# Patient Record
Sex: Male | Born: 1990 | Race: Black or African American | Hispanic: No | Smoking: Never smoker
Health system: Southern US, Community
[De-identification: ages and names within clinical notes are randomized; demographics above are authoritative.]

---

## 2018-05-12 ENCOUNTER — Ambulatory Visit
Admission: EM | Admit: 2018-05-12 | Discharge: 2018-05-12 | Disposition: A | Discharge status: Home / Self Care | Payer: Self-pay | Attending: Emergency Medicine | Admitting: Emergency Medicine

## 2018-05-12 ENCOUNTER — Encounter: Payer: Self-pay | Admitting: Emergency Medicine

## 2018-05-12 ENCOUNTER — Other Ambulatory Visit: Payer: Self-pay

## 2018-05-12 DIAGNOSIS — R05 Cough: Secondary | ICD-10-CM

## 2018-05-12 DIAGNOSIS — R0981 Nasal congestion: Secondary | ICD-10-CM

## 2018-05-12 DIAGNOSIS — J029 Acute pharyngitis, unspecified: Secondary | ICD-10-CM

## 2018-05-12 DIAGNOSIS — J020 Streptococcal pharyngitis: Secondary | ICD-10-CM

## 2018-05-12 LAB — RAPID STREP SCREEN (MED CTR MEBANE ONLY): Streptococcus, Group A Screen (Direct): POSITIVE — AB

## 2018-05-12 MED ORDER — IBUPROFEN 600 MG PO TABS: 600.0000 mg | ORAL_TABLET | Freq: Four times a day (QID) | ORAL | 0 refills | Status: DC | PRN

## 2018-05-12 MED ORDER — DEXAMETHASONE SODIUM PHOSPHATE 10 MG/ML IJ SOLN
10.0000 mg | Freq: Once | INTRAMUSCULAR | Status: AC
  Administered 2018-05-12: 10 mg via INTRAMUSCULAR

## 2018-05-12 MED ORDER — PENICILLIN G BENZATHINE 1200000 UNIT/2ML IM SUSP
1.2000 10*6.[IU] | Freq: Once | INTRAMUSCULAR | Status: AC
  Administered 2018-05-12: 1.2 10*6.[IU] via INTRAMUSCULAR

## 2018-05-12 NOTE — Discharge Instructions (Signed)
We treated you for strep throat today with a shot of Bicillin and Decadron, which is a steroid to help with the pain and swelling.  1 gram of Tylenol and 600 mg ibuprofen together 3-4 times a day as needed for pain.  Make sure you drink plenty of extra fluids.  Some people find salt water gargles and  Traditional Medicinal's "Throat Coat" tea helpful. Take 5 mL of liquid Benadryl and 5 mL of Maalox. Mix it together, and then hold it in your mouth for as long as you can and then swallow. You may do this 4 times a day.    Here is a list of primary care providers who are taking new patients:  Dr. Elizabeth Sauer, Dr. Schuyler Amor [address] [place] [address] [phone]  [facility] [address] [place] [address]  [phone]  [facility] [address] [place], [place] [address] [phone]  [facility] [address] [place]  [phone] [place], [place] [address]  Here are clinics/ other resources who will see you if you do not have insurance. Some have certain criteria that you must meet. Call them and find out what they are:  [facility]: [address], [place], [place] [address] Phone: [phone] Hours: First and Third Saturdays of each Month, 9 a.m. - 1 p.m.  [facility]: [address] [place], [place] [address] Phone: [phone] Hours: Tuesday, 4 p.m. - 8 p.m. Thursday, 1 p.m. - 8 p.m. Wednesday, 9 a.m. - [facility] [address] [place], [place] [address] Phone: [phone] Pharmacy Phone Number: [phone] Dental Phone Number: [phone] [facility] Insurance Help: [phone]  Dental Hours: Monday - Thursday, 8 a.m. - 6 p.m.  [facility] [address], [place], [place] [address] Phone: [phone] Pharmacy Phone Number: [phone] [facility] Insurance Help: [phone]  [facility] [address] [place]  [place], [place] [address] Phone: [phone] Pharmacy Phone Number: [phone] [facility] Insurance Help: [phone]  [facility] [address] [place], [place] [address] Phone: [phone] [facility] Insurance Help: [phone]   [facility] [address], [place], [place] [address] Phone: [phone]  Go to www.goodrx.com to look up your medications. This will give you a list of where you can find your prescriptions at the most affordable prices. Or ask the pharmacist what the cash price is, or if they have any other discount programs available to help make your medication more affordable. This can be less expensive than what you would pay with insurance.

## 2018-05-12 NOTE — ED Provider Notes (Signed)
HPI  SUBJECTIVE:  Patient reports sore throat starting 2 days ago. Sx worse with swallowing.  Sx better with ice cream. Has been taking TheraFlu, cough drops, hot soups,  salt water gargles, Chloraseptic w/ o relief.  No fever   No neck stiffness  +Cough, nasal congestion.  No rhinorrhea, postnasal drip. No Myalgias + Headache No Rash     No Recent Strep no exposure No Abdominal Pain No reflux sxs No Allergy sxs  No Breathing difficulty, voice changes + Feels like his throat is swollen, but not swelling shut No Drooling No Trismus No abx in past month.  No antipyretic in past 4-6 hrs Past medical history negative for frequent strep, diabetes, hypertension PMD: None   History reviewed. No pertinent past medical history.  History reviewed. No pertinent surgical history.  Family History  Problem Relation Age of Onset  . Healthy Mother   . Healthy Father     Social History   Tobacco Use  . Smoking status: Never Smoker  . Smokeless tobacco: Never Used  Substance Use Topics  . Alcohol use: Not Currently  . Drug use: Never     Current Facility-Administered Medications:  .  dexamethasone (DECADRON) injection 10 mg, 10 mg, Intramuscular, Once, Domenick Gong, MD .  penicillin g benzathine (BICILLIN LA) 1200000 UNIT/2ML injection 1.2 Million Units, 1.2 Million Units, Intramuscular, Once, Domenick Gong, MD  Current Outpatient Medications:  .  ibuprofen (ADVIL,MOTRIN) 600 MG tablet, Take 1 tablet (600 mg total) by mouth every 6 (six) hours as needed., Disp: 30 tablet, Rfl: 0  No Known Allergies   ROS  As noted in HPI.   Physical Exam  BP (!) 149/102 (BP Location: Left Arm)   Pulse (!) 101   Temp 98.2 F (36.8 C) (Oral)   Resp 16   Ht 5\' 8"  (1.727 m)   Wt 88.5 kg   SpO2 98%   BMI 29.65 kg/m   Constitutional: Well developed, well nourished, no acute distress Eyes:  EOMI, conjunctiva normal bilaterally HENT: Normocephalic, atraumatic,mucus  membranes moist. +  nasal congestion + intensely erythematous oropharynx + enlarged tonsils + exudates. Uvula midline.  Respiratory: Normal inspiratory effort Cardiovascular: Normal rate, no murmurs, rubs, gallops GI: nondistended, nontender. No appreciable splenomegaly skin: No rash, skin intact Lymph: + Anterior cervical LN.  + single posterior cervical lymph node R side Musculoskeletal: no deformities Neurologic: Alert & oriented x 3, no focal neuro deficits Psychiatric: Speech and behavior appropriate. At baseline mental status per caregiver.   ED Course   Medications  penicillin g benzathine (BICILLIN LA) 1200000 UNIT/2ML injection 1.2 Million Units (has no administration in time range)  dexamethasone (DECADRON) injection 10 mg (has no administration in time range)    Orders Placed This Encounter  Procedures  . Rapid Strep Screen ([facility] ONLY)    Standing Status:   Standing    Number of Occurrences:   1    Results for orders placed or performed during the hospital encounter of 05/12/18 (from the past 24 hour(s))  Rapid Strep Screen ([facility] ONLY)     Status: Abnormal   Collection Time: 05/12/18 10:40 AM  Result Value Ref Range   Streptococcus, Group A Screen (Direct) POSITIVE (A) NEGATIVE   No results found.  ED Clinical Impression  Strep pharyngitis   ED Assessment/Plan  Rapid strep positive. Bicillin 1,200,000 units IM, and Decadron 10 mg IM due to the tonsillar swelling.  There is no evidence of impending airway compromise.[facility] Home  with ibuprofen, Tylenol, Benadryl/Maalox mixture. Patient to followup with PMD of choice when necessary, will refer to local primary care resources.  Discussed labs,  MDM, plan and followup with patient. Discussed sn/sx that should prompt return to the ED. patient agrees with plan.   Meds ordered this encounter  Medications  . penicillin g benzathine (BICILLIN LA) 1200000 UNIT/2ML injection 1.2 Million Units    Order  Specific Question:   Antibiotic Indication:    Answer:   Pharyngitis  . dexamethasone (DECADRON) injection 10 mg  . ibuprofen (ADVIL,MOTRIN) 600 MG tablet    Sig: Take 1 tablet (600 mg total) by mouth every 6 (six) hours as needed.    Dispense:  30 tablet    Refill:  0     *This clinic note was created using Scientist, clinical (histocompatibility and immunogenetics). Therefore, there may be occasional mistakes despite careful proofreading.     Domenick Gong, MD 05/13/18 [phone]

## 2018-05-12 NOTE — ED Triage Notes (Signed)
Patient c/o sore throat since Wed.  Patient denies fevers

## 2021-05-06 ENCOUNTER — Other Ambulatory Visit: Payer: Self-pay

## 2021-05-06 ENCOUNTER — Encounter: Payer: Self-pay | Admitting: Emergency Medicine

## 2021-05-06 ENCOUNTER — Emergency Department: Payer: 59

## 2021-05-06 ENCOUNTER — Emergency Department
Admission: EM | Admit: 2021-05-06 | Discharge: 2021-05-07 | Disposition: A | Discharge status: Home / Self Care | Payer: 59 | Attending: Student in an Organized Health Care Education/Training Program | Admitting: Student in an Organized Health Care Education/Training Program

## 2021-05-06 DIAGNOSIS — R202 Paresthesia of skin: Secondary | ICD-10-CM | POA: Insufficient documentation

## 2021-05-06 DIAGNOSIS — R791 Abnormal coagulation profile: Secondary | ICD-10-CM | POA: Diagnosis not present

## 2021-05-06 DIAGNOSIS — R29810 Facial weakness: Secondary | ICD-10-CM | POA: Diagnosis present

## 2021-05-06 LAB — CBC
HCT: 46.5 % (ref 39.0–52.0)
Hemoglobin: 14.9 g/dL (ref 13.0–17.0)
MCH: 27.1 pg (ref 26.0–34.0)
MCHC: 32 g/dL (ref 30.0–36.0)
MCV: 84.7 fL (ref 80.0–100.0)
Platelets: 275 10*3/uL (ref 150–400)
RBC: 5.49 MIL/uL (ref 4.22–5.81)
RDW: 13.3 % (ref 11.5–15.5)
WBC: 5.5 10*3/uL (ref 4.0–10.5)
nRBC: 0 % (ref 0.0–0.2)

## 2021-05-06 LAB — COMPREHENSIVE METABOLIC PANEL
ALT: 47 U/L — ABNORMAL HIGH (ref 0–44)
AST: 31 U/L (ref 15–41)
Albumin: 4.2 g/dL (ref 3.5–5.0)
Alkaline Phosphatase: 53 U/L (ref 38–126)
Anion gap: 9 (ref 5–15)
BUN: 14 mg/dL (ref 6–20)
CO2: 25 mmol/L (ref 22–32)
Calcium: 9.2 mg/dL (ref 8.9–10.3)
Chloride: 103 mmol/L (ref 98–111)
Creatinine, Ser: 1.2 mg/dL (ref 0.61–1.24)
GFR, Estimated: >60 mL/min (ref >60)
Glucose, Bld: 98 mg/dL (ref 70–99)
Potassium: 3.5 mmol/L (ref 3.5–5.1)
Sodium: 137 mmol/L (ref 135–145)
Total Bilirubin: 0.7 mg/dL (ref 0.3–1.2)
Total Protein: 7.9 g/dL (ref 6.5–8.1)

## 2021-05-06 LAB — DIFFERENTIAL
Abs Immature Granulocytes: 0.02 10*3/uL (ref 0.00–0.07)
Basophils Absolute: 0 10*3/uL (ref 0.0–0.1)
Basophils Relative: 0 %
Eosinophils Absolute: 0.2 10*3/uL (ref 0.0–0.5)
Eosinophils Relative: 4 %
Immature Granulocytes: 0 %
Lymphocytes Relative: 34 %
Lymphs Abs: 1.9 10*3/uL (ref 0.7–4.0)
Monocytes Absolute: 0.5 10*3/uL (ref 0.1–1.0)
Monocytes Relative: 9 %
Neutro Abs: 2.8 10*3/uL (ref 1.7–7.7)
Neutrophils Relative %: 53 %

## 2021-05-06 LAB — PROTIME-INR
INR: 1 (ref 0.8–1.2)
Prothrombin Time: 12.7 seconds (ref 11.4–15.2)

## 2021-05-06 LAB — APTT: aPTT: 30 seconds (ref 24–36)

## 2021-05-06 MED ORDER — PREDNISONE 10 MG PO TABS: 10.0000 mg | ORAL_TABLET | Freq: Every day | ORAL | 0 refills | Status: DC

## 2021-05-06 MED ORDER — VALACYCLOVIR HCL 1 G PO TABS: 1000.0000 mg | ORAL_TABLET | Freq: Three times a day (TID) | ORAL | 0 refills | Status: AC

## 2021-05-06 MED ORDER — GADOBUTROL 1 MMOL/ML IV SOLN
10.0000 mL | Freq: Once | INTRAVENOUS | Status: AC | PRN
  Administered 2021-05-06: 10 mL via INTRAVENOUS

## 2021-05-06 NOTE — ED Triage Notes (Signed)
Pt comes into the ED via POV c/o right side facial droop.  Pt states is started this morning around 3:00am.  Pt had recent dentistry done on the right side as well.  PT does present hypertensive.  Denies any other weakness currently.  Pt unable to close right eye all the way when eyes are closed.

## 2021-05-06 NOTE — ED Notes (Signed)
Patient transported to MRI 

## 2021-05-06 NOTE — Consult Note (Signed)
[facility] TeleNeurology Consult Services  Stat Consult  Patient Name:   Malik Young, Malik Young Date of Birth:   10/08/1990 Identification Number:   MRN - [id number] Date of Service:   05/06/2021 22:41:31  Diagnosis:       G51.0 - Bell palsy  Impression Manjinder Caraway is a 31yo gentleman pmh low back pain who presented to the ER with Right sided facial weakness since 0300am today. MRI brain w/wo incidental b/l white matter lesions in pattern suggestive of chronic demyelinating disease, no evidence of active demyelination. Discussed with patient that peripheral nerve disorder/bells palsy most likely, but that he may have underlying autoimmune condition that warrants further neurologic work up. Patient prefers to have further work up as an outpatient which is reasonable. Recommend outpatient neurology follow up and MS work up in 3-6 weeks.  Our recommendations are outlined below.  Disposition: Outpatient Neurology follow up in 3-6 weeks.  Additional Recommendations: House-Brackmann grade 4, recommend Prednisone 60-80mg  for 7 days.  Consider artificial tears for day time and eye ointment/patch for bedtime.    ----------------------------------------------------------------------------------------------------  Metrics: [facility] Notification Time: 05/06/2021 22:37:23 Stamp Time: 05/06/2021 22:41:31 Callback Response Time: 05/06/2021 22:42:57    Imaging CLINICAL DATA: Initial evaluation for neuro deficit, stroke suspected.   EXAM: MRI HEAD WITHOUT AND WITH CONTRAST   TECHNIQUE: Multiplanar, multiecho pulse sequences of the brain and surrounding structures were obtained without and with intravenous contrast.   CONTRAST: 54mL GADAVIST GADOBUTROL 1 MMOL/ML IV SOLN   COMPARISON: Prior head CT from earlier the same day.   FINDINGS: Brain: Examination somewhat technically limited by susceptibility artifact from dental hardware.   Cerebral volume within  normal limits for age. There are scattered patchy foci of T2/FLAIR signal primarily involving the subcortical white matter of both cerebral hemispheres, perhaps slightly worse on the right as compared to the left. Relatively mild involvement of the deep and periventricular white matter in comparison. No visible infratentorial involvement on this exam. No convincing associated enhancement. Apparent subtle patchy enhancement involving the left greater than right subcortical parieto-occipital regions noted on axial postcontrast sequence, not seen on corresponding coronal sequence, and favored to be artifactual (series 18, image 105).   No evidence for acute or subacute infarct. Gray-white matter differentiation maintained. No areas of chronic cortical infarction. No visible acute or chronic intracranial blood products.   No mass lesion, midline shift or mass effect no hydrocephalus or extra-axial fluid collection. Pituitary gland suprasellar region grossly within normal limits. Midline structures intact. No other abnormal enhancement.   Vascular: Major intracranial vascular flow voids are maintained.   Skull and upper cervical spine: Craniocervical junction within normal limits. Signal intensity within the visualized bone marrow somewhat diffusely decreased on T1 weighted imaging, nonspecific, but most commonly related to anemia, smoking or obesity. No focal marrow replacing lesion. No scalp soft tissue abnormality.   Sinuses/Orbits: Visualized globes and orbital soft tissues grossly within normal limits, although evaluation limited by motion and susceptibility artifact. Visualized paranasal sinuses are grossly clear. No mastoid effusion.   Other: None.   IMPRESSION: 1. Scattered patchy foci of T2/FLAIR signal abnormality involving primarily the subcortical white matter of both cerebral hemispheres, overall moderate in nature, and advanced for age. Differential considerations are  broad, and include sequelae of accelerated/hereditary chronic small-vessel ischemia, changes related to prior trauma, hypercoagulable state, vasculitis, sequelae of complicated migraines, prior infectious or inflammatory process, or demyelination. No convincing associated enhancement. 2. Otherwise unremarkable brain MRI. No other acute intracranial abnormality.     Electronically  Signed By: Jeannine Boga M.D. On: 05/06/2021 22:51  Labs cbc wnl. bun/cr/na wnl   ----------------------------------------------------------------------------------------------------  Chief Complaint: Right facial weakness  History of Present Illness: Patient is a 31 year old Male. Malik Young is a 31yo gentleman pmh low back pain who presented to the ER with Right sided facial weakness since 0300am today. His fiance is at bedside. No h/o transient neurologic deficits or similar symptoms.    Past Medical History:      There is no history of Hypertension      There is no history of Diabetes Mellitus      There is no history of Hyperlipidemia      There is no history of Atrial Fibrillation      There is no history of Coronary Artery Disease      There is no history of Stroke  Medications:  No Anticoagulant use  No Antiplatelet use Reviewed EMR for current medications  Allergies:  Reviewed  Social History: Smoking: No Alcohol Use: No Drug Use: No  Family History:  There is no family history of premature cerebrovascular disease pertinent to this consultation  ROS : 14 Points Review of Systems was performed and was negative except mentioned in HPI.  Past Surgical History: There Is No Surgical History Contributory To Todays Visit    Examination: BP(145/96), Pulse(74), Blood Glucose(98)  Neuro Exam: General: Alert,Awake, Oriented to Time, Place, Person  Speech: Fluent:  Language: Intact:  Face: Facial Droop: Right Upper and lower R facial weakness.  Facial  Sensation: Intact:  Visual Fields: Intact:  Extraocular Movements: Intact:  Motor Exam: No Drift:  Sensation: Intact:  Coordination: Intact:     Patient / Family was informed the Neurology Consult would occur via TeleHealth consult by way of interactive audio and video telecommunications and consented to receiving care in this manner.  Patient is being evaluated for possible acute neurologic impairment and high probability of imminent or life - threatening deterioration.I spent total of 30 minutes providing care to this patient, including time for face to face visit via telemedicine, review of medical records, imaging studies and discussion of findings with providers, the patient and / or family.   Dr Wendi Snipes   [facility] [phone]  Case AB:[id number]

## 2021-05-06 NOTE — ED Notes (Signed)
Tele Neurology cart placed in room.

## 2021-05-06 NOTE — ED Provider Notes (Signed)
[facility] Provider Note    Event Date/Time   First MD Initiated Contact with Patient 05/06/21 1909     (approximate)   History   Facial Droop   HPI  Malik Young is a 32 y.o. male with reported remote history of Bell's palsy no other chronic medical conditions presents to the ER for evaluation of right facial droop.  States that he started having some tingling on the right cheek before going to bed last night.  Then throughout the day has noted progressively worsening droop and having trouble closing his right eye.  He has had adjustment in his braces recently and thought it was related to that.  Denies any trauma no recent fevers.  Denies any chest pain or pressure.  No nausea or vomiting.     Physical Exam   Triage Vital Signs: ED Triage Vitals  Enc Vitals Group     BP 05/06/21 1804 (!) 165/127     Pulse Rate 05/06/21 1804 81     Resp 05/06/21 1804 17     Temp 05/06/21 1804 98.4 F (36.9 C)     Temp Source 05/06/21 1804 Oral     SpO2 05/06/21 1804 97 %     Weight 05/06/21 1805 220 lb (99.8 kg)     Height 05/06/21 1805 5\' 8"  (1.727 m)     Head Circumference --      Peak Flow --      Pain Score 05/06/21 1805 1     Pain Loc --      Pain Edu? --      Excl. in [place]? --     Most recent vital signs: Vitals:   05/06/21 1804 05/06/21 2137  BP: (!) 165/127 (!) 145/96  Pulse: 81 74  Resp: 17 16  Temp: 98.4 F (36.9 C) 97.9 F (36.6 C)  SpO2: 97% 96%     Constitutional: Alert  Eyes: Conjunctivae are normal.  Head: Atraumatic. Nose: No congestion/rhinnorhea. Mouth/Throat: Mucous membranes are moist.   Neck: Painless ROM.  Cardiovascular:   Good peripheral circulation. Respiratory: Normal respiratory effort.  No retractions.  Gastrointestinal: Soft and nontender.  Musculoskeletal:  no deformity Neurologic: CN- intact.  right facial droop with trace involvement of the forehead.  Normal FNF.  Normal heel to shin.  Sensation intact  bilaterally. Normal speech and language. No gross focal neurologic deficits are appreciated. No gait instability.  Skin:  Skin is warm, dry and intact. No rash noted. Psychiatric: Mood and affect are normal. Speech and behavior are normal.    ED Results / Procedures / Treatments   Labs (all labs ordered are listed, but only abnormal results are displayed) Labs Reviewed  COMPREHENSIVE METABOLIC PANEL - Abnormal; Notable for the following components:      Result Value   ALT 47 (*)    All other components within normal limits  PROTIME-INR  APTT  CBC  DIFFERENTIAL  CBG MONITORING, ED     EKG  ED ECG REPORT I, Merlyn Lot, the attending physician, personally viewed and interpreted this ECG.   Date: 05/06/2021  EKG Time: 18:11  Rate: 75  Rhythm: sinus  Axis: normal  Intervals:  normal intervals  ST&T Change: inferolateral t wave inversions    RADIOLOGY Please see ED Course for my review and interpretation.  I personally reviewed all radiographic images ordered to evaluate for the above acute complaints and reviewed radiology reports and findings.  These findings were personally discussed with the patient.  Please see medical record for radiology report.    PROCEDURES:  Critical Care performed: No  Procedures   MEDICATIONS ORDERED IN ED: Medications  gadobutrol (GADAVIST) 1 MMOL/ML injection 10 mL (10 mLs Intravenous Contrast Given 05/06/21 2055)     IMPRESSION / MDM / ASSESSMENT AND PLAN / ED COURSE  I reviewed the triage vital signs and the nursing notes.                              Differential diagnosis includes, but is not limited to, cva, bells palsy, tia, hypoglycemia, dehydration, electrolyte abnormality, dissection, sepsis  Patient presenting with right facial droop.  The patient will be placed on continuous pulse oximetry and telemetry for monitoring.  Laboratory evaluation will be sent to evaluate for the above complaints.    Clinical Course  as of 05/06/21 2332  Thu May 06, 2021  1951 CT imaging of my review does not show any evidence of subdural.  Radiology report concern for white matter hypodensity concerning for possible demyelinating disease.  MRI recommended.  Will order. [PR]  2059 My review of MRI concern the patient has some patchy areas scattered throughout his brain concerning for demyelinating process. [PR]  2233 Discussed case in consultation with radiology regarding patient's MRI.  No sign of acute stroke but significant subcortical white matter disease broad differential including vasculitis demyelinating disease previous infection.  Given his new neuro symptoms I will  consult neurology. [PR]  2327 Case discussed with teleneurology who is recommended treating for Bell's palsy and outpatient neurology follow-up.  Patient agreeable to plan.  Discussed supportive care including eyecare and signs and symptoms for which he should return. [PR]    Clinical Course User Index [PR] Merlyn Lot, MD     FINAL CLINICAL IMPRESSION(S) / ED DIAGNOSES   Final diagnoses:  Facial droop     Rx / DC Orders   ED Discharge Orders          Ordered    predniSONE (DELTASONE) 10 MG tablet  Daily        05/06/21 2329    valACYclovir (VALTREX) 1000 MG tablet  3 times daily        05/06/21 2329    Ambulatory referral to Neurology       Comments: An appointment is requested in approximately: 2 weeks   05/06/21 2330             Note:  This document was prepared using Dragon voice recognition software and may include unintentional dictation errors.    Merlyn Lot, MD 05/06/21 [phone]

## 2021-05-07 ENCOUNTER — Encounter: Payer: Self-pay | Admitting: Neurology

## 2021-05-07 NOTE — ED Notes (Signed)
Pt verbalized understanding of discharge instructions, follow-up care, and prescription medications. Pt advised if symptoms worsen or come back to return to ED.

## 2021-07-29 ENCOUNTER — Ambulatory Visit: Payer: 59 | Admitting: Neurology

## 2021-09-29 ENCOUNTER — Telehealth: Payer: Self-pay | Admitting: Family Medicine

## 2021-09-29 NOTE — Telephone Encounter (Signed)
[facility] Child and Adolescent psychiatrist received and forwarded a direct referral on this patient. Patient needs are unclear and unable to link to referral to clarify. Contacted [facility] IM department and nurse is to be notified to re-submit referral via fax or through general referral queue so this may be accessed and processed.

## 2021-10-24 NOTE — Progress Notes (Unsigned)
Psychiatric Initial Adult Assessment   Patient Identification: Malik Young MRN:  [id number] Date of Evaluation:  10/26/2021 Referral Source: Debbra Riding [facility],*  Chief Complaint:   Chief Complaint  Patient presents with   Establish Care   Visit Diagnosis:    ICD-10-CM   1. Current moderate episode of major depressive disorder without prior episode (HCC)  F32.1       History of Present Illness:   Malik Young is a 31 y.o. year old male with a history of depression, hypertension , who is referred for depression.  - according to the chart review, his girlfriend raised concern of him loading guns, while the patient stated that he was unloading.   He states that he was referred by his primary care provider.  He has been out of work since June.  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  This was back up option, and he has been doing the current work for the past 10 years.  He does not like this work as he is by himself, not being able to see his family or friends.  He usually enjoys being around with others.  He thinks his mood varies day by day.  Although he used to be upbeat, he occasionally does not enjoy the time with his fiancee/his step children.  He has been trying to find a reason not to do things with them.  There was a time he put a gun in his mouth with suicidal ideation.  However, he did not complete it as he did not want his fiancee/his stepchildren to deal with this.  He has not thought of any SI at least for the past week.  He thinks he has been doing better since being on Lexapro.  He also states that he has an upcoming interview at the other job in the same company.  He thinks he will do better even he might be more stressed at the new job, being around with others and not being bored as the current work.  He reports good support from his fiancee. He wants to do better for his family. He is hoping to be back to college to complete bachelor's degree in the  future.  Although he was seen by a therapist twice, and he felt comfortable talking, he was "skipped (referred)"  to another therapist.  He does not feel comfortable seeing another therapist as he tried.  However, he decided to have this appointment as this was recommended by his PCP, who he feels comfortable to talk with.   Depression-he has depressive symptoms as in PHQ-9. He eats at night. He sleeps 5 am-11 am, which has been improving compared to before (he used to work night shift). He denies SI. He agrees to secure his firearm so that he does not have access to it.   Mania-he reports decreased need for sleep for 2 days.  He was in front of TV, not doing anything.  It was the first experience for him.  He denies increased goal directed activity or euphonia.   Substance-he denies alcohol use or   Medication- lexapro 10 mg daily (for three weeks. He reports worsening in sexual side effect, increasing in appetite at night. )  Substance- he rarely drinks, denies drug use   Wt Readings from Last 3 Encounters:  10/26/21 223 lb 3.2 oz (101.2 kg)  05/06/21 220 lb (99.8 kg)  05/12/18 195 lb (88.5 kg)     Support: fiance Household: fiance,  2 step children Marital status: single Number of children: 0  Employment: truck Retail banker:  associate degree. Could not complete bachelor's degree due to having difficulty in balancing with his work Last PCP / ongoing medical evaluation:      Associated Signs/Symptoms: Depression Symptoms:  depressed mood, anhedonia, insomnia, fatigue, difficulty concentrating, (Hypo) Manic Symptoms:   denies decreased need for sleep, euphoria Anxiety Symptoms:   denies Psychotic Symptoms:   denies AH, VH, paranoia PTSD Symptoms: Negative  Past Psychiatric History:  Outpatient:  Psychiatry admission: denies Previous suicide attempt: denies (tried to put a gun in his mouth) Past trials of medication: lexapro History of violence:    Previous  Psychotropic Medications: Yes   Substance Abuse History in the last 12 months:  No.  Consequences of Substance Abuse: NA  Past Medical History: History reviewed. No pertinent past medical history. History reviewed. No pertinent surgical history.  Family Psychiatric History: denies   Family History:  Family History  Problem Relation Age of Onset   Healthy Mother    Healthy Father     Social History:   Social History   Socioeconomic History   Marital status: Married    Spouse name: Not on file   Number of children: 0   Years of education: Not on file   Highest education level: Associate degree: occupational, Scientist, product/process development, or vocational program  Occupational History   Not on file  Tobacco Use   Smoking status: Never   Smokeless tobacco: Never  Vaping Use   Vaping Use: Never used  Substance and Sexual Activity   Alcohol use: Not Currently   Drug use: Never   Sexual activity: Yes  Other Topics Concern   Not on file  Social History Narrative   Not on file   Social Determinants of Health   Financial Resource Strain: Not on file  Food Insecurity: Not on file  Transportation Needs: Not on file  Physical Activity: Not on file  Stress: Not on file  Social Connections: Not on file    Additional Social History: as above  Allergies:  No Known Allergies  Metabolic Disorder Labs: No results found for: "HGBA1C", "MPG" No results found for: "PROLACTIN" No results found for: "CHOL", "TRIG", "HDL", "CHOLHDL", "VLDL", "LDLCALC" No results found for: "TSH"  Therapeutic Level Labs: No results found for: "LITHIUM" No results found for: "CBMZ" No results found for: "VALPROATE"  Current Medications: Current Outpatient Medications  Medication Sig Dispense Refill   amLODipine (NORVASC) 10 MG tablet Take 10 mg by mouth daily.     buPROPion (WELLBUTRIN XL) 150 MG 24 hr tablet Take 1 tablet (150 mg total) by mouth daily. 30 tablet 1   hydrochlorothiazide (HYDRODIURIL) 25 MG  tablet Take 25 mg by mouth daily.     lisinopril (ZESTRIL) 5 MG tablet Take 5 mg by mouth daily.     potassium chloride (KLOR-CON) 10 MEQ tablet Take 10 mEq by mouth daily.     No current facility-administered medications for this visit.    Musculoskeletal: Strength & Muscle Tone: within normal limits Gait & Station: normal Patient leans: N/A  Psychiatric Specialty Exam: Review of Systems  Psychiatric/Behavioral:  Positive for decreased concentration, dysphoric mood and sleep disturbance. Negative for agitation, behavioral problems, confusion, hallucinations, self-injury and suicidal ideas. The patient is not nervous/anxious and is not hyperactive.   All other systems reviewed and are negative.   Blood pressure 129/84, pulse 80, temperature 97.9 F (36.6 C), temperature source Temporal, height 5\' 9"  (1.753 m),  weight 223 lb 3.2 oz (101.2 kg).Body mass index is 32.96 kg/m.  General Appearance: Fairly Groomed  Eye Contact:  Good  Speech:  Clear and Coherent  Volume:  Normal  Mood:  Depressed  Affect:  Appropriate, Congruent, and down, slightly restricted  Thought Process:  Coherent  Orientation:  Full (Time, Place, and Person)  Thought Content:  Logical  Suicidal Thoughts:  No  Homicidal Thoughts:  No  Memory:  Immediate;   Good  Judgement:  Good  Insight:  Good  Psychomotor Activity:  Normal  Concentration:  Concentration: Good and Attention Span: Good  Recall:  Good  Fund of Knowledge:Good  Language: Good  Akathisia:  No  Handed:  Right  AIMS (if indicated):  not done  Assets:  Communication Skills Desire for Improvement  ADL's:  Intact  Cognition: WNL  Sleep:  Poor   Screenings: GAD-7    Flowsheet Row [facility] Visit from 10[facility]26/2021 in [facility]  Total GAD-7 Score 15      PHQ2-9    Flowsheet Row Office Visit from 10/26/2021 in [facility]  PHQ-2 Total Score 6  PHQ-9 Total Score 20      Flowsheet Row  Office Visit from 10/26/2021 in [facility] from [facility]/06/2021 in [facility]  C-SSRS RISK CATEGORY Moderate Risk No Risk       Assessment and Plan:  Malik Young is a 31 y.o. year old male with a history of depression, hypertension , who is referred for depression.   1. Current moderate episode of major depressive disorder without prior episode (HCC) He reports worsening in depressive symptoms over the past few months, although it has been slightly improving since being on Lexapro/currently on leave of absence from work.  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  He reports good support from his fiancee, and recently has reconnected with his friend.  Will switch from Lexapro to bupropion given he reports adverse reaction of worsening in sexual side effect/increasing in appetite since being on Lexapro.  He has no known history of seizure.  Discussed potential risk of perpetration, headache from bupropion.  Discussed potential risk of discontinuation symptoms.  Although he will greatly benefit from CBT, he is not interested in this option at this time due to his prior experience with his therapist, who reportedly referred ("skipped") out to other therapist.   Plan  Start bupropion 150 mg daily  Decrease lexapro 5 mg daily for three days, then discontinue Next appointment: 9/12 at 10 AM for 30 mins, in person Emergency resources which includes 911, ED, suicide crisis line [phone]) are discussed.   TSH -0.833 in July 2023  The patient demonstrates the following risk factors for suicide: Chronic risk factors for suicide include: psychiatric disorder of depression . Acute risk factors for suicide include: N/A. Protective factors for this patient include: positive social support, responsibility to others (children, family), coping skills, and hope for the future. Considering  these factors, the overall suicide risk at this point appears to be low. Patient is appropriate for outpatient follow up. Although he has a firearm at home, he agrees to limit the access so that his fiance can only have it.      Collaboration of Care: Other reviewed note from his PCP  Patient/Guardian was advised Release of Information must be obtained prior to any record release in order to collaborate their care with an outside provider. Patient/Guardian was  advised if they have not already done so to contact the registration department to sign all necessary forms in order for [place] to release information regarding their care.   Consent: Patient/Guardian gives verbal consent for treatment and assignment of benefits for services provided during this visit. Patient/Guardian expressed understanding and agreed to proceed.   Neysa Hotter, MD 8/1/202310:04 AM

## 2021-10-26 ENCOUNTER — Ambulatory Visit (INDEPENDENT_AMBULATORY_CARE_PROVIDER_SITE_OTHER): Payer: BC Managed Care – PPO | Admitting: Psychiatry

## 2021-10-26 ENCOUNTER — Encounter: Payer: Self-pay | Admitting: Psychiatry

## 2021-10-26 VITALS — BP 129/84 | HR 80 | Temp 97.9°F | Ht 69.0 in | Wt 223.2 lb

## 2021-10-26 DIAGNOSIS — F321 Major depressive disorder, single episode, moderate: Secondary | ICD-10-CM | POA: Diagnosis not present

## 2021-10-26 MED ORDER — BUPROPION HCL ER (XL) 150 MG PO TB24: 150.0000 mg | ORAL_TABLET | Freq: Every day | ORAL | 1 refills | Status: DC

## 2021-12-05 NOTE — Progress Notes (Deleted)
BH MD/PA/NP OP Progress Note  12/05/2021 3:24 PM Malik Young  MRN:  [id number]  Chief Complaint: No chief complaint on file.  HPI: *** Visit Diagnosis: No diagnosis found.  Past Psychiatric History: Please see initial evaluation for full details. I have reviewed the history. No updates at this time.     Past Medical History: No past medical history on file. No past surgical history on file.  Family Psychiatric History: Please see initial evaluation for full details. I have reviewed the history. No updates at this time.     Family History:  Family History  Problem Relation Age of Onset   Healthy Mother    Healthy Father     Social History:  Social History   Socioeconomic History   Marital status: Married    Spouse name: Not on file   Number of children: 0   Years of education: Not on file   Highest education level: Associate degree: occupational, Scientist, product/process development, or vocational program  Occupational History   Not on file  Tobacco Use   Smoking status: Never   Smokeless tobacco: Never  Vaping Use   Vaping Use: Never used  Substance and Sexual Activity   Alcohol use: Not Currently   Drug use: Never   Sexual activity: Yes  Other Topics Concern   Not on file  Social History Narrative   Not on file   Social Determinants of Health   Financial Resource Strain: Not on file  Food Insecurity: Not on file  Transportation Needs: Not on file  Physical Activity: Not on file  Stress: Not on file  Social Connections: Not on file    Allergies: No Known Allergies  Metabolic Disorder Labs: No results found for: "HGBA1C", "MPG" No results found for: "PROLACTIN" No results found for: "CHOL", "TRIG", "HDL", "CHOLHDL", "VLDL", "LDLCALC" No results found for: "TSH"  Therapeutic Level Labs: No results found for: "LITHIUM" No results found for: "VALPROATE" No results found for: "CBMZ"  Current Medications: Current Outpatient Medications  Medication Sig Dispense Refill    amLODipine (NORVASC) 10 MG tablet Take 10 mg by mouth daily.     buPROPion (WELLBUTRIN XL) 150 MG 24 hr tablet Take 1 tablet (150 mg total) by mouth daily. 30 tablet 1   hydrochlorothiazide (HYDRODIURIL) 25 MG tablet Take 25 mg by mouth daily.     lisinopril (ZESTRIL) 5 MG tablet Take 5 mg by mouth daily.     potassium chloride (KLOR-CON) 10 MEQ tablet Take 10 mEq by mouth daily.     No current facility-administered medications for this visit.     Musculoskeletal: Strength & Muscle Tone: within normal limits Gait & Station: normal Patient leans: N/A  Psychiatric Specialty Exam: Review of Systems  There were no vitals taken for this visit.There is no height or weight on file to calculate BMI.  General Appearance: {Appearance:22683}  Eye Contact:  {BHH EYE CONTACT:22684}  Speech:  Clear and Coherent  Volume:  Normal  Mood:  {BHH MOOD:22306}  Affect:  {Affect (PAA):22687}  Thought Process:  Coherent  Orientation:  Full (Time, Place, and Person)  Thought Content: Logical   Suicidal Thoughts:  {ST/HT (PAA):22692}  Homicidal Thoughts:  {ST/HT (PAA):22692}  Memory:  Immediate;   Good  Judgement:  {Judgement (PAA):22694}  Insight:  {Insight (PAA):22695}  Psychomotor Activity:  Normal  Concentration:  Concentration: Good and Attention Span: Good  Recall:  Good  Fund of Knowledge: Good  Language: Good  Akathisia:  No  Handed:  Right  AIMS (if  indicated): not done  Assets:  Communication Skills Desire for Improvement  ADL's:  Intact  Cognition: WNL  Sleep:  {BHH GOOD/FAIR/POOR:22877}   Screenings: GAD-7    Flowsheet Row Office Visit from 10/26/2021 in [facility]  Total GAD-7 Score 15      PHQ2-9    Flowsheet Row Office Visit from 10/26/2021 in [facility]  PHQ-2 Total Score 6  PHQ-9 Total Score 20      Flowsheet Row Office Visit from 10/26/2021 in [facility] ED from 05/06/2021 in  [facility]  C-SSRS RISK CATEGORY Moderate Risk No Risk        Assessment and Plan:  Malik Young is a 31 y.o. year old male with a history of depression, hypertension , who presents for follow up appointment for below.     1. Current moderate episode of major depressive disorder without prior episode (HCC) He reports worsening in depressive symptoms over the past few months, although it has been slightly improving since being on Lexapro/currently on leave of absence from work.  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  He reports good support from his fiancee, and recently has reconnected with his friend.  Will switch from Lexapro to bupropion given he reports adverse reaction of worsening in sexual side effect/increasing in appetite since being on Lexapro.  He has no known history of seizure.  Discussed potential risk of perpetration, headache from bupropion.  Discussed potential risk of discontinuation symptoms.  Although he will greatly benefit from CBT, he is not interested in this option at this time due to his prior experience with his therapist, who reportedly referred ("skipped") out to other therapist.    Plan   Start bupropion 150 mg daily  Decrease lexapro 5 mg daily for three days, then discontinue Next appointment: 9/12 at 10 AM for 30 mins, in person Emergency resources which includes 911, ED, suicide crisis line [phone]) are discussed.   TSH -0.833 in July 2023   The patient demonstrates the following risk factors for suicide: Chronic risk factors for suicide include: psychiatric disorder of depression . Acute risk factors for suicide include: N/A. Protective factors for this patient include: positive social support, responsibility to others (children, family), coping skills, and hope for the future. Considering these factors, the overall suicide risk at this point appears to  be low. Patient is appropriate for outpatient follow up. Although he has a firearm at home, he agrees to limit the access so that his fiance can only have it.   Salley Scarlet       Collaboration of Care: Collaboration of Care: {BH OP Collaboration of [employer]  Patient/Guardian was advised Release of Information must be obtained prior to any record release in order to collaborate their care with an outside provider. Patient/Guardian was advised if they have not already done so to contact the registration department to sign all necessary forms in order for [place] to release information regarding their care.   Consent: Patient/Guardian gives verbal consent for treatment and assignment of benefits for services provided during this visit. Patient/Guardian expressed understanding and agreed to proceed.    Neysa Hotter, MD 12/05/2021, 3:24 PM

## 2021-12-07 ENCOUNTER — Ambulatory Visit: Payer: 59 | Admitting: Psychiatry

## 2021-12-16 ENCOUNTER — Other Ambulatory Visit: Payer: Self-pay | Admitting: Psychiatry

## 2022-01-12 ENCOUNTER — Other Ambulatory Visit: Payer: Self-pay | Admitting: Psychiatry

## 2022-01-12 NOTE — Telephone Encounter (Signed)
Defer to Dr.Hisada 

## 2022-01-23 NOTE — Progress Notes (Deleted)
[facility] MD/PA/NP OP Progress Note  01/23/2022 2:03 PM Malik Young  MRN:  [id number]  Chief Complaint: No chief complaint on file.  HPI: *** Visit Diagnosis: No diagnosis found.  Past Psychiatric History: Please see initial evaluation for full details. I have reviewed the history. No updates at this time.     Past Medical History: No past medical history on file. No past surgical history on file.  Family Psychiatric History: Please see initial evaluation for full details. I have reviewed the history. No updates at this time.     Family History:  Family History  Problem Relation Age of Onset   Healthy Mother    Healthy Father     Social History:  Social History   Socioeconomic History   Marital status: Married    Spouse name: Not on file   Number of children: 0   Years of education: Not on file   Highest education level: Associate degree: occupational, Hotel manager, or vocational program  Occupational History   Not on file  Tobacco Use   Smoking status: Never   Smokeless tobacco: Never  Vaping Use   Vaping Use: Never used  Substance and Sexual Activity   Alcohol use: Not Currently   Drug use: Never   Sexual activity: Yes  Other Topics Concern   Not on file  Social History Narrative   Not on file   Social Determinants of Health   Financial Resource Strain: Not on file  Food Insecurity: Not on file  Transportation Needs: Not on file  Physical Activity: Not on file  Stress: Not on file  Social Connections: Not on file    Allergies: No Known Allergies  Metabolic Disorder Labs: No results found for: "HGBA1C", "MPG" No results found for: "PROLACTIN" No results found for: "CHOL", "TRIG", "HDL", "CHOLHDL", "VLDL", "LDLCALC" No results found for: "TSH"  Therapeutic Level Labs: No results found for: "LITHIUM" No results found for: "VALPROATE" No results found for: "CBMZ"  Current Medications: Current Outpatient Medications  Medication Sig Dispense Refill    amLODipine (NORVASC) 10 MG tablet Take 10 mg by mouth daily.     buPROPion (WELLBUTRIN XL) 150 MG 24 hr tablet Take 1 tablet (150 mg total) by mouth daily. 30 tablet 0   hydrochlorothiazide (HYDRODIURIL) 25 MG tablet Take 25 mg by mouth daily.     lisinopril (ZESTRIL) 5 MG tablet Take 5 mg by mouth daily.     potassium chloride (KLOR-CON) 10 MEQ tablet Take 10 mEq by mouth daily.     No current facility-administered medications for this visit.     Musculoskeletal: Strength & Muscle Tone: within normal limits Gait & Station: normal Patient leans: N/A  Psychiatric Specialty Exam: Review of Systems  There were no vitals taken for this visit.There is no height or weight on file to calculate BMI.  General Appearance: {Appearance:22683}  Eye Contact:  {BHH EYE CONTACT:22684}  Speech:  Clear and Coherent  Volume:  Normal  Mood:  {BHH MOOD:22306}  Affect:  {Affect (PAA):22687}  Thought Process:  Coherent  Orientation:  Full (Time, Place, and Person)  Thought Content: Logical   Suicidal Thoughts:  {ST/HT (PAA):22692}  Homicidal Thoughts:  {ST/HT (PAA):22692}  Memory:  Immediate;   Good  Judgement:  {Judgement (PAA):22694}  Insight:  {Insight (PAA):22695}  Psychomotor Activity:  Normal  Concentration:  Concentration: Good and Attention Span: Good  Recall:  Good  Fund of Knowledge: Good  Language: Good  Akathisia:  No  Handed:  Right  AIMS (if  indicated): not done  Assets:  Communication Skills Desire for Improvement  ADL's:  Intact  Cognition: WNL  Sleep:  {BHH GOOD/FAIR/POOR:22877}   Screenings: GAD-7    Flowsheet Row Office Visit from 10/26/2021 in [facility]  Total GAD-7 Score 15      PHQ2-9    Flowsheet Row Office Visit from 10/26/2021 in [facility]  PHQ-2 Total Score 6  PHQ-9 Total Score 20      Flowsheet Row Office Visit from 10/26/2021 in [facility] ED from 05/06/2021 in  [facility]  C-SSRS RISK CATEGORY Moderate Risk No Risk        Assessment and Plan:  Malik Young is a 31 y.o. year old male with a history of depression, hypertension, who presents for follow up appointment for below.    1. Current moderate episode of major depressive disorder without prior episode (HCC) He reports worsening in depressive symptoms over the past few months, although it has been slightly improving since being on Lexapro/currently on leave of absence from work.  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  He reports good support from his fiancee, and recently has reconnected with his friend.  Will switch from Lexapro to bupropion given he reports adverse reaction of worsening in sexual side effect/increasing in appetite since being on Lexapro.  He has no known history of seizure.  Discussed potential risk of perpetration, headache from bupropion.  Discussed potential risk of discontinuation symptoms.  Although he will greatly benefit from CBT, he is not interested in this option at this time due to his prior experience with his therapist, who reportedly referred ("skipped") out to other therapist.    Plan   Start bupropion 150 mg daily  Decrease lexapro 5 mg daily for three days, then discontinue Next appointment: 9/12 at 10 AM for 30 mins, in person Emergency resources which includes 911, ED, suicide crisis line [phone]) are discussed.   TSH -0.833 in July 2023   The patient demonstrates the following risk factors for suicide: Chronic risk factors for suicide include: psychiatric disorder of depression . Acute risk factors for suicide include: N/A. Protective factors for this patient include: positive social support, responsibility to others (children, family), coping skills, and hope for the future. Considering these factors, the overall suicide risk at this point appears to be  low. Patient is appropriate for outpatient follow up. Although he has a firearm at home, he agrees to limit the access so that his fiance can only have it.        Collaboration of Care: Collaboration of Care: {BH OP Collaboration of Care:21014065}  Patient/Guardian was advised Release of Information must be obtained prior to any record release in order to collaborate their care with an outside provider. Patient/Guardian was advised if they have not already done so to contact the registration department to sign all necessary forms in order for [place] to release information regarding their care.   Consent: Patient/Guardian gives verbal consent for treatment and assignment of benefits for services provided during this visit. Patient/Guardian expressed understanding and agreed to proceed.    Neysa Hotter, MD 01/23/2022, 2:03 PM

## 2022-01-25 ENCOUNTER — Ambulatory Visit: Payer: 59 | Admitting: Psychiatry

## 2022-02-11 ENCOUNTER — Other Ambulatory Visit: Payer: Self-pay | Admitting: Psychiatry

## 2022-03-16 NOTE — Progress Notes (Deleted)
[facility] MD/PA/NP OP Progress Note  03/16/2022 11:39 AM Malik Young  MRN:  [id number]  Chief Complaint: No chief complaint on file.  HPI: *** Visit Diagnosis: No diagnosis found.  Past Psychiatric History: Please see initial evaluation for full details. I have reviewed the history. No updates at this time.     Past Medical History: No past medical history on file. No past surgical history on file.  Family Psychiatric History: Please see initial evaluation for full details. I have reviewed the history. No updates at this time.     Family History:  Family History  Problem Relation Age of Onset   Healthy Mother    Healthy Father     Social History:  Social History   Socioeconomic History   Marital status: Significant Other    Spouse name: Not on file   Number of children: 0   Years of education: Not on file   Highest education level: Associate degree: occupational, Scientist, product/process development, or vocational program  Occupational History   Not on file  Tobacco Use   Smoking status: Never   Smokeless tobacco: Never  Vaping Use   Vaping Use: Never used  Substance and Sexual Activity   Alcohol use: Not Currently   Drug use: Never   Sexual activity: Yes  Other Topics Concern   Not on file  Social History Narrative   Not on file   Social Determinants of Health   Financial Resource Strain: Not on file  Food Insecurity: Not on file  Transportation Needs: Not on file  Physical Activity: Not on file  Stress: Not on file  Social Connections: Not on file    Allergies: No Known Allergies  Metabolic Disorder Labs: No results found for: "HGBA1C", "MPG" No results found for: "PROLACTIN" No results found for: "CHOL", "TRIG", "HDL", "CHOLHDL", "VLDL", "LDLCALC" No results found for: "TSH"  Therapeutic Level Labs: No results found for: "LITHIUM" No results found for: "VALPROATE" No results found for: "CBMZ"  Current Medications: Current Outpatient Medications  Medication Sig Dispense  Refill   amLODipine (NORVASC) 10 MG tablet Take 10 mg by mouth daily.     buPROPion (WELLBUTRIN XL) 150 MG 24 hr tablet Take 1 tablet (150 mg total) by mouth daily. 30 tablet 0   hydrochlorothiazide (HYDRODIURIL) 25 MG tablet Take 25 mg by mouth daily.     lisinopril (ZESTRIL) 5 MG tablet Take 5 mg by mouth daily.     potassium chloride (KLOR-CON) 10 MEQ tablet Take 10 mEq by mouth daily.     No current facility-administered medications for this visit.     Musculoskeletal: Strength & Muscle Tone: within normal limits Gait & Station: normal Patient leans: N/A  Psychiatric Specialty Exam: Review of Systems  There were no vitals taken for this visit.There is no height or weight on file to calculate BMI.  General Appearance: {Appearance:22683}  Eye Contact:  {BHH EYE CONTACT:22684}  Speech:  Clear and Coherent  Volume:  Normal  Mood:  {BHH MOOD:22306}  Affect:  {Affect (PAA):22687}  Thought Process:  Coherent  Orientation:  Full (Time, Place, and Person)  Thought Content: Logical   Suicidal Thoughts:  {ST/HT (PAA):22692}  Homicidal Thoughts:  {ST/HT (PAA):22692}  Memory:  Immediate;   Good  Judgement:  {Judgement (PAA):22694}  Insight:  {Insight (PAA):22695}  Psychomotor Activity:  Normal  Concentration:  Concentration: Good and Attention Span: Good  Recall:  Good  Fund of Knowledge: Good  Language: Good  Akathisia:  No  Handed:  Right  AIMS (  if indicated): not done  Assets:  Communication Skills Desire for Improvement  ADL's:  Intact  Cognition: WNL  Sleep:  {BHH GOOD/FAIR/POOR:22877}   Screenings: GAD-7    Flowsheet Row Office Visit from 10/26/2021 in [place]  Total GAD-7 Score 15      PHQ2-9    [place] Visit from 10/26/2021 in [place]  PHQ-2 Total Score 6  PHQ-9 Total Score 20      [place] Visit from 10/26/2021 in [place] ED from 05/06/2021  in [place] CATEGORY Moderate Risk No Risk        Assessment and Plan:  Malik Young is a 31 y.o. year old male with a history of depression, hypertension, who presents for follow up appointment for below.    1. Current moderate episode of major depressive disorder without prior episode ([place]) He reports worsening in depressive symptoms over the past few months, although it has been slightly improving since being on Lexapro/currently on leave of absence from work.  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  He reports good support from his fiancee, and recently has reconnected with his friend.  Will switch from Lexapro to bupropion given he reports adverse reaction of worsening in sexual side effect/increasing in appetite since being on Lexapro.  He has no known history of seizure.  Discussed potential risk of perpetration, headache from bupropion.  Discussed potential risk of discontinuation symptoms.  Although he will greatly benefit from CBT, he is not interested in this option at this time due to his prior experience with his therapist, who reportedly referred ("skipped") out to other therapist.    Plan   Start bupropion 150 mg daily  Decrease lexapro 5 mg daily for three days, then discontinue Next appointment: 9/12 at 10 AM for 30 mins, in person Emergency resources which includes 911, ED, suicide crisis line [phone]) are discussed.   TSH -0.833 in July 2023   The patient demonstrates the following risk factors for suicide: Chronic risk factors for suicide include: psychiatric disorder of depression . Acute risk factors for suicide include: N/A. Protective factors for this patient include: positive social support, responsibility to others (children, family), coping skills, and hope for the future. Considering these factors, the overall suicide risk at this point appears to  be low. Patient is appropriate for outpatient follow up. Although he has a firearm at home, he agrees to limit the access so that his fiance can only have it.        Collaboration of Care: Collaboration of Care: {BH OP Collaboration of Care:21014065}  Patient/Guardian was advised Release of Information must be obtained prior to any record release in order to collaborate their care with an outside provider. Patient/Guardian was advised if they have not already done so to contact the registration department to sign all necessary forms in order for [place] to release information regarding their care.   Consent: Patient/Guardian gives verbal consent for treatment and assignment of benefits for services provided during this visit. Patient/Guardian expressed understanding and agreed to proceed.    Norman Clay, MD 03/16/2022, 11:39 AM

## 2022-03-17 ENCOUNTER — Ambulatory Visit: Payer: 59 | Admitting: Psychiatry

## 2022-03-31 ENCOUNTER — Other Ambulatory Visit: Payer: Self-pay | Admitting: Psychiatry

## 2022-03-31 NOTE — Telephone Encounter (Signed)
Ordered refill of bupropion to last until his next appointment in Feb per request.  Could you contact him to see if he is interested in available slot today? If it does not work, please advise him that I will not be able to do any more refills without evaluation regardless of the reason. He was seen last in August, and that was the visit he had with me, fyi.

## 2022-04-01 NOTE — Telephone Encounter (Signed)
Patient scheduled an appt for 04-04-22 at 8am

## 2022-04-03 NOTE — Progress Notes (Unsigned)
BH MD/PA/NP OP Progress Note  04/04/2022 8:35 AM Malik Young  MRN:  [id number]  Chief Complaint:  Chief Complaint  Patient presents with   Follow-up   HPI:  This is a follow-up appointment for depression.  He is not seen since last August.  He states that he has been doing better since being on bupropion.  He started a new job last November.  It starts at 3 AM.  He likes the current job, and reports less stress in relation to work.  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  He had a good time with his friend at the restaurant the at that time.  He is excited that the game will be released soon.  He states that it has been a little difficult for his family to have time all together.  He has not been able to talk with his mother, who is in assisted living facility in [place].  He does not want to talk about this as much, stating that he feels she is the only family member, considering estranged relationship with his siblings. The patient has mood symptoms as in PHQ-9/GAD-7.  Although he reports occasional passive SI, it has improved significantly.  He denies any plan or intent, and agrees to contact emergency resources if any worsening.  He has been doing stress eating, and is concerned of weight gain.  He denies issues with sexual function since switching from Lexapro to bupropion.  He feels that he was doing a little better when he was started on bupropion; he is willing to try higher dose at this time.   Wt Readings from Last 3 Encounters:  04/04/22 231 lb (104.8 kg)  10/26/21 223 lb 3.2 oz (101.2 kg)  05/06/21 220 lb (99.8 kg)     Support: fiance Household: fiance, 2 step children Marital status: single Number of children: 0  Employment: truck Retail banker:  associate degree. Could not complete bachelor's degree due to having difficulty in balancing with his work Last PCP / ongoing medical evaluation:   He grew up in [place].  He  reports a strange relationship with his father.  He was raised by his mother.  Although she was doing very well, she got sick all the time since surgery of hydrocephalus.  She is currently at the assisted living facility in [place].  Although they used to communicate often, he has had difficulty in contacting with her since she is in relationship with her boyfriend for the past 2 years.  He has estranged relationship with his siblings.   Visit Diagnosis:    ICD-10-CM   1. Current mild episode of major depressive disorder without prior episode (HCC)  F32.0       Past Psychiatric History: Please see initial evaluation for full details. I have reviewed the history. No updates at this time.     Past Medical History: History reviewed. No pertinent past medical history. History reviewed. No pertinent surgical history.  Family Psychiatric History: Please see initial evaluation for full details. I have reviewed the history. No updates at this time.     Family History:  Family History  Problem Relation Age of Onset   Healthy Mother    Healthy Father     Social History:  Social History   Socioeconomic History   Marital status: Significant Other    Spouse name: Not on file   Number of children: 0   Years of education: Not  on file   Highest education level: Associate degree: occupational, Hotel manager, or vocational program  Occupational History   Not on file  Tobacco Use   Smoking status: Never   Smokeless tobacco: Never  Vaping Use   Vaping Use: Never used  Substance and Sexual Activity   Alcohol use: Not Currently   Drug use: Never   Sexual activity: Yes  Other Topics Concern   Not on file  Social History Narrative   Not on file   Social Determinants of Health   Financial Resource Strain: Not on file  Food Insecurity: Not on file  Transportation Needs: Not on file  Physical Activity: Not on file  Stress: Not on file  Social Connections: Not on file    Allergies: No  Known Allergies  Metabolic Disorder Labs: No results found for: "HGBA1C", "MPG" No results found for: "PROLACTIN" No results found for: "CHOL", "TRIG", "HDL", "CHOLHDL", "VLDL", "LDLCALC" No results found for: "TSH"  Therapeutic Level Labs: No results found for: "LITHIUM" No results found for: "VALPROATE" No results found for: "CBMZ"  Current Medications: Current Outpatient Medications  Medication Sig Dispense Refill   amLODipine (NORVASC) 10 MG tablet Take 10 mg by mouth daily.     [START ON 04/11/2022] buPROPion (WELLBUTRIN XL) 300 MG 24 hr tablet Take 1 tablet (300 mg total) by mouth daily. 30 tablet 1   hydrochlorothiazide (HYDRODIURIL) 25 MG tablet Take 25 mg by mouth daily.     lisinopril (ZESTRIL) 5 MG tablet Take 5 mg by mouth daily.     potassium chloride (KLOR-CON) 10 MEQ tablet Take 10 mEq by mouth daily.     No current facility-administered medications for this visit.     Musculoskeletal: Strength & Muscle Tone: within normal limits Gait & Station: normal Patient leans: N/A  Psychiatric Specialty Exam: Review of Systems  Psychiatric/Behavioral:  Positive for dysphoric mood, sleep disturbance and suicidal ideas. Negative for agitation, behavioral problems, confusion, decreased concentration, hallucinations and self-injury. The patient is nervous/anxious. The patient is not hyperactive.   All other systems reviewed and are negative.   Blood pressure 135/89, pulse 76, temperature 98.1 F (36.7 C), temperature source Oral, height 5\' 9"  (1.753 m), weight 231 lb (104.8 kg), SpO2 98 %.Body mass index is 34.11 kg/m.  General Appearance: Fairly Groomed  Eye Contact:  Good  Speech:  Clear and Coherent  Volume:  Normal  Mood:   better  Affect:  Appropriate, Congruent, and calm  Thought Process:  Coherent  Orientation:  Full (Time, Place, and Person)  Thought Content: Logical   Suicidal Thoughts:  No  Homicidal Thoughts:  No  Memory:  Immediate;   Good  Judgement:   Good  Insight:  Good  Psychomotor Activity:  Normal  Concentration:  Concentration: Good and Attention Span: Good  Recall:  Good  Fund of Knowledge: Good  Language: Good  Akathisia:  No  Handed:  Right  AIMS (if indicated): not done  Assets:  Communication Skills Desire for Improvement  ADL's:  Intact  Cognition: WNL  Sleep:  Fair   Screenings: GAD-7    Flowsheet Row Office Visit from 04/04/2022 in [place] Visit from 10/26/2021 in [place]  Total GAD-7 Score 7 15      PHQ2-9    [place] Visit from 04/04/2022 in [place] Office Visit from 10/26/2021 in [place]  PHQ-2 Total Score 2 6  PHQ-9 Total Score 7 20  Flowsheet Row Office Visit from 04/04/2022 in [facility] Office Visit from 10/26/2021 in [facility] ED from 05/06/2021 in [facility]  C-SSRS RISK CATEGORY No Risk Moderate Risk No Risk        Assessment and Plan:  Sloane Junkin is a 31 y.o. year old male with a history of depression, hypertension, who presents for follow up appointment for below.   1. Current mild episode of major depressive disorder without prior episode (HCC) There has been overall improvement in depressive symptoms since switching from Lexapro to bupropion, which also coincided with starting a new job.  He enjoys his new work , reports good support from his fiancee, and recently has reconnected with his friend.  Will uptitrate bupropion to optimize treatment for depression. He has no known history of seizure. Noted that he reports weight gain, which is likely secondary to stress eating.  Will continue to monitor this.  Although he will greatly benefit from CBT, he was not interested in this option due to his prior experience with his therapist, who reportedly  referred ("skipped") out to other therapist.    Plan   Increase bupropion 300 mg daily - monitor weight gain Next appointment: 2/29 at 4:30 for 30 mins, in person Emergency resources which includes 911, ED, suicide crisis line [phone]) are discussed.   TSH -0.833 in July 2023  Past trials of meds: lexapro (weight gain, sexual side effect)   The patient demonstrates the following risk factors for suicide: Chronic risk factors for suicide include: psychiatric disorder of depression . Acute risk factors for suicide include: N/A. Protective factors for this patient include: positive social support, responsibility to others (children, family), coping skills, and hope for the future. Considering these factors, the overall suicide risk at this point appears to be low. Patient is appropriate for outpatient follow up. Although he has a firearm at home, he agrees to limit the access so that his fiance can only have it.        Collaboration of Care: Collaboration of Care: Other reviewed notes in Epic  Patient/Guardian was advised Release of Information must be obtained prior to any record release in order to collaborate their care with an outside provider. Patient/Guardian was advised if they have not already done so to contact the registration department to sign all necessary forms in order for [place] to release information regarding their care.   Consent: Patient/Guardian gives verbal consent for treatment and assignment of benefits for services provided during this visit. Patient/Guardian expressed understanding and agreed to proceed.    Neysa Hotter, MD 04/04/2022, 8:35 AM

## 2022-04-04 ENCOUNTER — Ambulatory Visit: Payer: BC Managed Care – PPO | Admitting: Psychiatry

## 2022-04-04 ENCOUNTER — Encounter: Payer: Self-pay | Admitting: Psychiatry

## 2022-04-04 VITALS — BP 135/89 | HR 76 | Temp 98.1°F | Ht 69.0 in | Wt 231.0 lb

## 2022-04-04 DIAGNOSIS — F320 Major depressive disorder, single episode, mild: Secondary | ICD-10-CM

## 2022-04-04 MED ORDER — BUPROPION HCL ER (XL) 300 MG PO TB24: 300.0000 mg | ORAL_TABLET | Freq: Every day | ORAL | 1 refills | Status: AC

## 2022-04-04 NOTE — Patient Instructions (Signed)
Increase bupropion 300 mg daily  Next appointment: 2/29 at 4:30

## 2022-05-19 ENCOUNTER — Ambulatory Visit: Payer: 59 | Admitting: Psychiatry

## 2022-05-21 NOTE — Progress Notes (Deleted)
[place] MD/PA/NP OP Progress Note  05/21/2022 4:38 PM Brenan Lavender  MRN:  [id number]  Chief Complaint: No chief complaint on file.  HPI: *** Visit Diagnosis: No diagnosis found.  Past Psychiatric History: Please see initial evaluation for full details. I have reviewed the history. No updates at this time.     Past Medical History: No past medical history on file. No past surgical history on file.  Family Psychiatric History: Please see initial evaluation for full details. I have reviewed the history. No updates at this time.     Family History:  Family History  Problem Relation Age of Onset   Healthy Mother    Healthy Father     Social History:  Social History   Socioeconomic History   Marital status: Significant Other    Spouse name: Not on file   Number of children: 0   Years of education: Not on file   Highest education level: Associate degree: occupational, Hotel manager, or vocational program  Occupational History   Not on file  Tobacco Use   Smoking status: Never   Smokeless tobacco: Never  Vaping Use   Vaping Use: Never used  Substance and Sexual Activity   Alcohol use: Not Currently   Drug use: Never   Sexual activity: Yes  Other Topics Concern   Not on file  Social History Narrative   Not on file   Social Determinants of Health   Financial Resource Strain: Not on file  Food Insecurity: Not on file  Transportation Needs: Not on file  Physical Activity: Not on file  Stress: Not on file  Social Connections: Not on file    Allergies: No Known Allergies  Metabolic Disorder Labs: No results found for: "HGBA1C", "MPG" No results found for: "PROLACTIN" No results found for: "CHOL", "TRIG", "HDL", "CHOLHDL", "VLDL", "LDLCALC" No results found for: "TSH"  Therapeutic Level Labs: No results found for: "LITHIUM" No results found for: "VALPROATE" No results found for: "CBMZ"  Current Medications: Current Outpatient Medications  Medication Sig Dispense  Refill   amLODipine (NORVASC) 10 MG tablet Take 10 mg by mouth daily.     buPROPion (WELLBUTRIN XL) 300 MG 24 hr tablet Take 1 tablet (300 mg total) by mouth daily. 30 tablet 1   hydrochlorothiazide (HYDRODIURIL) 25 MG tablet Take 25 mg by mouth daily.     lisinopril (ZESTRIL) 5 MG tablet Take 5 mg by mouth daily.     potassium chloride (KLOR-CON) 10 MEQ tablet Take 10 mEq by mouth daily.     No current facility-administered medications for this visit.     Musculoskeletal: Strength & Muscle Tone: within normal limits Gait & Station: normal Patient leans: N/A  Psychiatric Specialty Exam: Review of Systems  There were no vitals taken for this visit.There is no height or weight on file to calculate BMI.  General Appearance: {Appearance:22683}  Eye Contact:  {BHH EYE CONTACT:22684}  Speech:  Clear and Coherent  Volume:  Normal  Mood:  {BHH MOOD:22306}  Affect:  {Affect (PAA):22687}  Thought Process:  Coherent  Orientation:  Full (Time, Place, and Person)  Thought Content: Logical   Suicidal Thoughts:  {ST/HT (PAA):22692}  Homicidal Thoughts:  {ST/HT (PAA):22692}  Memory:  Immediate;   Good  Judgement:  {Judgement (PAA):22694}  Insight:  {Insight (PAA):22695}  Psychomotor Activity:  Normal  Concentration:  Concentration: Good and Attention Span: Good  Recall:  Good  Fund of Knowledge: Good  Language: Good  Akathisia:  No  Handed:  Right  AIMS (  if indicated): not done  Assets:  Communication Skills Desire for Improvement  ADL's:  Intact  Cognition: WNL  Sleep:  {BHH GOOD/FAIR/POOR:22877}   Screenings: GAD-7    Flowsheet Row Office Visit from 04/04/2022 in [place] Office Visit from 10/26/2021 in [place]  Total GAD-7 Score 7 15      PHQ2-9    [place] Office Visit from 04/04/2022 in [place] Office Visit from 10/26/2021 in [place]  PHQ-2 Total Score 2 6  PHQ-9 Total Score 7 20      [place] Office Visit from 04/04/2022 in [place] Office Visit from 10/26/2021 in [place] ED from 05/06/2021 in [facility] at [place] No Risk Moderate Risk No Risk        Assessment and Plan:  Christipher Casucci is a 32 y.o. year old male with a history of depression, hypertension, who presents for follow up appointment for below.    1. Current mild episode of major depressive disorder without prior episode ([place]) There has been overall improvement in depressive symptoms since switching from Lexapro to bupropion, which also coincided with starting a new job.  He enjoys his new work , reports good support from his fiancee, and recently has reconnected with his friend.  Will uptitrate bupropion to optimize treatment for depression. He has no known history of seizure. Noted that he reports weight gain, which is likely secondary to stress eating.  Will continue to monitor this.  Although he will greatly benefit from CBT, he was not interested in this option due to his prior experience with his therapist, who reportedly referred ("skipped") out to other therapist.    Plan   Increase bupropion 300 mg daily - monitor weight gain Next appointment: 2/29 at 4:30 for 30 mins, in person Emergency resources which includes 911, ED, suicide crisis line [phone]) are discussed.   TSH -0.833 in July 2023   Past trials of meds: lexapro (weight gain, sexual side effect)   The patient demonstrates the following risk factors for suicide: Chronic risk factors for suicide include: psychiatric disorder of depression . Acute risk factors for suicide include: N/A. Protective factors for this patient include: positive social support, responsibility to others (children, family),  coping skills, and hope for the future. Considering these factors, the overall suicide risk at this point appears to be low. Patient is appropriate for outpatient follow up. Although he has a firearm at home, he agrees to limit the access so that his fiance can only have it.     Collaboration of Care: Collaboration of Care: {BH OP Collaboration of Care:21014065}  Patient/Guardian was advised Release of Information must be obtained prior to any record release in order to collaborate their care with an outside provider. Patient/Guardian was advised if they have not already done so to contact the registration department to sign all necessary forms in order for [place] to release information regarding their care.   Consent: Patient/Guardian gives verbal consent for treatment and assignment of benefits for services provided during this visit. Patient/Guardian expressed understanding and agreed to proceed.    Norman Clay, MD 05/21/2022, 4:38 PM

## 2022-05-26 ENCOUNTER — Ambulatory Visit: Payer: 59 | Admitting: Psychiatry

## 2022-07-26 NOTE — Progress Notes (Deleted)
[facility] MD/PA/NP OP Progress Note  07/26/2022 5:48 PM Malik Young  MRN:  [id number]  Chief Complaint: No chief complaint on file.  HPI:  - he is not seen since Jan Visit Diagnosis: No diagnosis found.  Past Psychiatric History: Please see initial evaluation for full details. I have reviewed the history. No updates at this time.     Past Medical History: No past medical history on file. No past surgical history on file.  Family Psychiatric History: Please see initial evaluation for full details. I have reviewed the history. No updates at this time.     Family History:  Family History  Problem Relation Age of Onset   Healthy Mother    Healthy Father     Social History:  Social History   Socioeconomic History   Marital status: Significant Other    Spouse name: Not on file   Number of children: 0   Years of education: Not on file   Highest education level: Associate degree: occupational, Scientist, product/process development, or vocational program  Occupational History   Not on file  Tobacco Use   Smoking status: Never   Smokeless tobacco: Never  Vaping Use   Vaping Use: Never used  Substance and Sexual Activity   Alcohol use: Not Currently   Drug use: Never   Sexual activity: Yes  Other Topics Concern   Not on file  Social History Narrative   Not on file   Social Determinants of Health   Financial Resource Strain: Not on file  Food Insecurity: Not on file  Transportation Needs: Not on file  Physical Activity: Not on file  Stress: Not on file  Social Connections: Not on file    Allergies: No Known Allergies  Metabolic Disorder Labs: No results found for: "HGBA1C", "MPG" No results found for: "PROLACTIN" No results found for: "CHOL", "TRIG", "HDL", "CHOLHDL", "VLDL", "LDLCALC" No results found for: "TSH"  Therapeutic Level Labs: No results found for: "LITHIUM" No results found for: "VALPROATE" No results found for: "CBMZ"  Current Medications: Current Outpatient Medications   Medication Sig Dispense Refill   amLODipine (NORVASC) 10 MG tablet Take 10 mg by mouth daily.     buPROPion (WELLBUTRIN XL) 300 MG 24 hr tablet Take 1 tablet (300 mg total) by mouth daily. 30 tablet 1   hydrochlorothiazide (HYDRODIURIL) 25 MG tablet Take 25 mg by mouth daily.     lisinopril (ZESTRIL) 5 MG tablet Take 5 mg by mouth daily.     potassium chloride (KLOR-CON) 10 MEQ tablet Take 10 mEq by mouth daily.     No current facility-administered medications for this visit.     Musculoskeletal: Strength & Muscle Tone: within normal limits Gait & Station: normal Patient leans: N/A  Psychiatric Specialty Exam: Review of Systems  There were no vitals taken for this visit.There is no height or weight on file to calculate BMI.  General Appearance: {Appearance:22683}  Eye Contact:  {BHH EYE CONTACT:22684}  Speech:  Clear and Coherent  Volume:  Normal  Mood:  {BHH MOOD:22306}  Affect:  {Affect (PAA):22687}  Thought Process:  Coherent  Orientation:  Full (Time, Place, and Person)  Thought Content: Logical   Suicidal Thoughts:  {ST/HT (PAA):22692}  Homicidal Thoughts:  {ST/HT (PAA):22692}  Memory:  Immediate;   Good  Judgement:  {Judgement (PAA):22694}  Insight:  {Insight (PAA):22695}  Psychomotor Activity:  Normal  Concentration:  Concentration: Good and Attention Span: Good  Recall:  Good  Fund of Knowledge: Good  Language: Good  Akathisia:  No  Handed:  Right  AIMS (if indicated): not done  Assets:  Communication Skills Desire for Improvement  ADL's:  Intact  Cognition: WNL  Sleep:  {BHH GOOD/FAIR/POOR:22877}   Screenings: GAD-7    Flowsheet Row Office Visit from 04/04/2022 in [facility] Office Visit from 10/26/2021 in [facility]  Total GAD-7 Score 7 15      PHQ2-9    Flowsheet Row Office Visit from 04/04/2022 in [facility] Office Visit from  10/26/2021 in [facility]  PHQ-2 Total Score 2 6  PHQ-9 Total Score 7 20      Flowsheet Row Office Visit from 04/04/2022 in [facility] Office Visit from 10/26/2021 in [facility] ED from 05/06/2021 in [facility] at [facility]  C-SSRS RISK CATEGORY No Risk Moderate Risk No Risk        Assessment and Plan:  Malik Young is a 32 y.o. year old male with a history of depression, hypertension, who presents for follow up appointment for below.    1. Current mild episode of major depressive disorder without prior episode ([facility]) There has been overall improvement in depressive symptoms since switching from Lexapro to bupropion, which also coincided with starting a new job.  He enjoys his new work , reports good support from his fiancee, and recently has reconnected with his friend.  Will uptitrate bupropion to optimize treatment for depression. He has no known history of seizure. Noted that he reports weight gain, which is likely secondary to stress eating.  Will continue to monitor this.  Although he will greatly benefit from CBT, he was not interested in this option due to his prior experience with his therapist, who reportedly referred ("skipped") out to other therapist.    Plan   Increase bupropion 300 mg daily - monitor weight gain Next appointment: 2/29 at 4:30 for 30 mins, in person Emergency resources which includes 911, ED, suicide crisis line [phone]) are discussed.   TSH -0.833 in July 2023   Past trials of meds: lexapro (weight gain, sexual side effect)   The patient demonstrates the following risk factors for suicide: Chronic risk factors for suicide include: psychiatric disorder of depression . Acute risk factors for suicide include: N/A. Protective factors for this patient include: positive social support, responsibility to others  (children, family), coping skills, and hope for the future. Considering these factors, the overall suicide risk at this point appears to be low. Patient is appropriate for outpatient follow up. Although he has a firearm at home, he agrees to limit the access so that his fiance can only have it.       Collaboration of Care: Collaboration of Care: {BH OP Collaboration of Care:21014065}  Patient/Guardian was advised Release of Information must be obtained prior to any record release in order to collaborate their care with an outside provider. Patient/Guardian was advised if they have not already done so to contact the registration department to sign all necessary forms in order for [place] to release information regarding their care.   Consent: Patient/Guardian gives verbal consent for treatment and assignment of benefits for services provided during this visit. Patient/Guardian expressed understanding and agreed to proceed.    Neysa Hotter, MD 07/26/2022, 5:48 PM

## 2022-07-28 ENCOUNTER — Ambulatory Visit: Payer: BC Managed Care – PPO | Admitting: Psychiatry

## 2023-08-01 IMAGING — MR MR HEAD WO/W CM
13 series · 46 of 48 positions shown · IV contrast (gadavist)
Comparison: Prior head CT from earlier the same day.

CLINICAL DATA: Initial evaluation for neuro deficit, stroke
suspected.

EXAM:
MRI HEAD WITHOUT AND WITH CONTRAST
TECHNIQUE: Multiplanar, multiecho pulse sequences of the brain and surrounding
structures were obtained without and with intravenous contrast.
CONTRAST:  10mL GADAVIST GADOBUTROL 1 MMOL/ML IV SOLN

[Series 5: ax dwi_tracew · axial · 3.0mm · 0.65mm/px · z∈[-78,+77]mm · 4 of 48 slices shown]
[im 1/48]
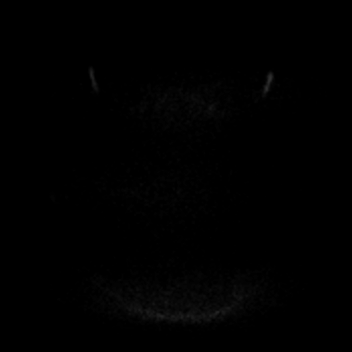
[im 16/48]
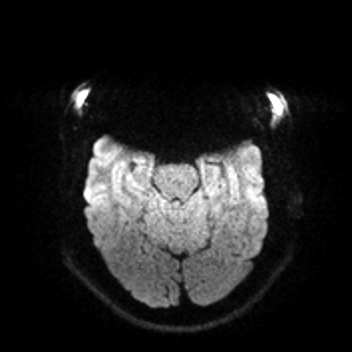
[im 32/48]
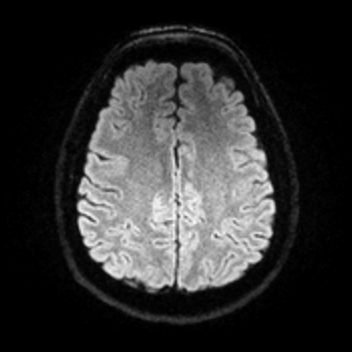
[im 48/48]
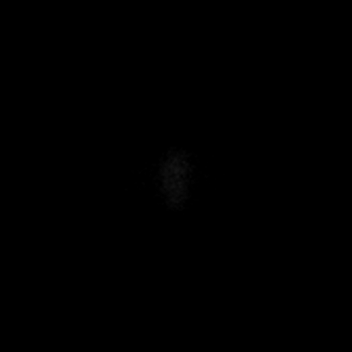

[Series 6: ax dwi_adc · axial · 3.0mm · 0.65mm/px · z∈[-78,+74]mm · 3 of 47 slices shown]
[im 1/47]
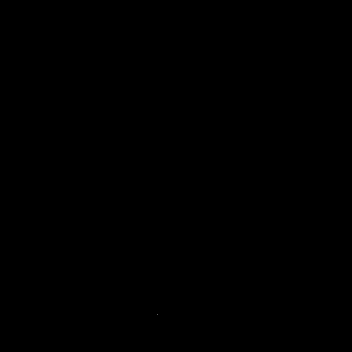
[im 24/47]
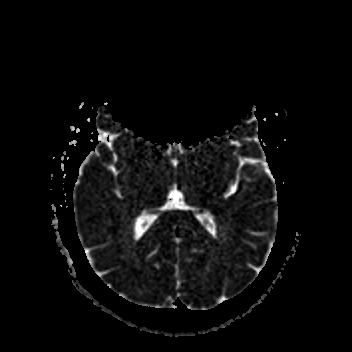
[im 47/47]
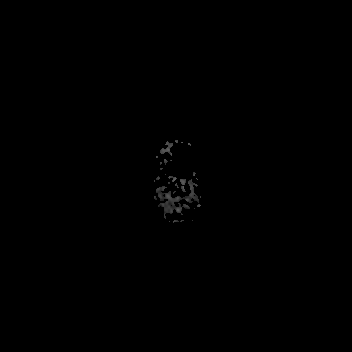

[Series 7: cor dwi_tracew · coronal · 5.0mm · 0.65mm/px · 2 of 40 slices shown]
[im 1/40]
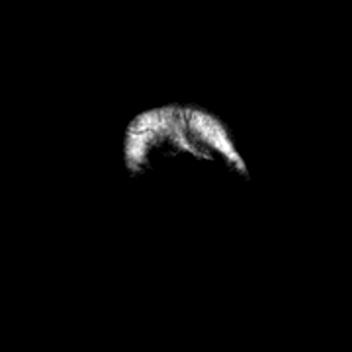
[im 40/40]
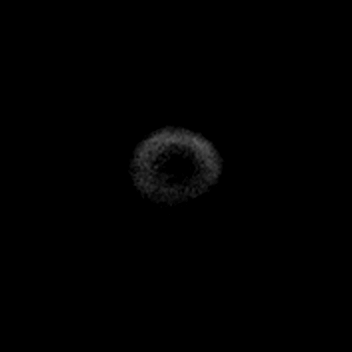

[Series 8: cor dwi_adc · coronal · 5.0mm · 0.65mm/px · 2 of 39 slices shown]
[im 1/39]
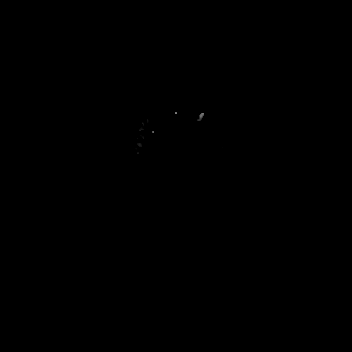
[im 39/39]
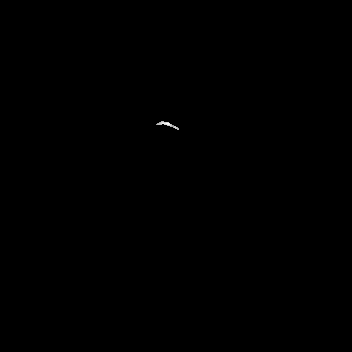

[Series 9: T1 · sagittal · 5.0mm · 0.62mm/px · 1 of 25 slices shown (1 of 2)]
[im 1/25]
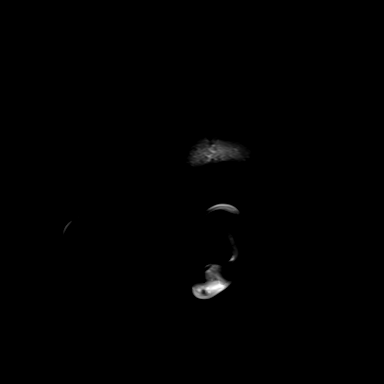

[Series 10: T2 · axial · 5.0mm · 0.53mm/px · z∈[-74,+75]mm · 2 of 26 slices shown]
[im 1/26]
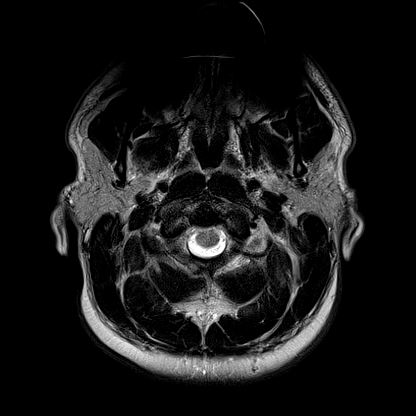
[im 26/26]
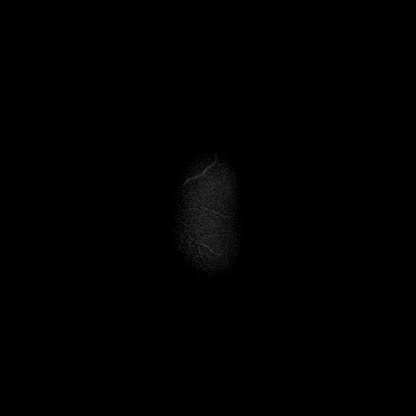

[Series 12: pha_images · axial · 3.0mm · 0.90mm/px · z∈[-88,+88]mm · 3 of 59 slices shown]
[im 1/59]
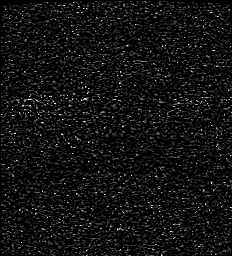
[im 30/59]
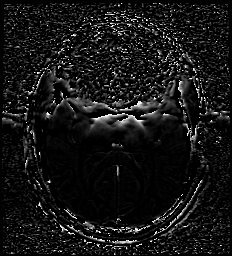
[im 59/59]
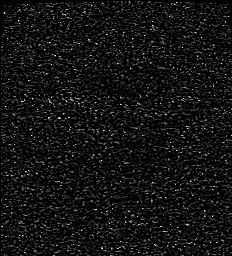

[Series 13: swi_images · axial · 3.0mm · 0.90mm/px · z∈[-88,+88]mm · 4 of 60 slices shown]
[im 1/60]
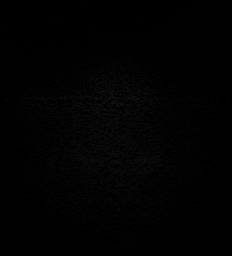
[im 20/60]
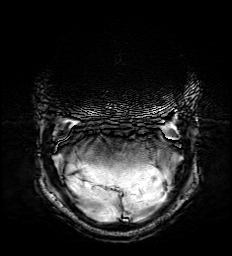
[im 40/60]
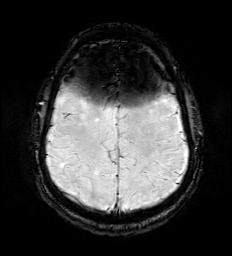
[im 60/60]
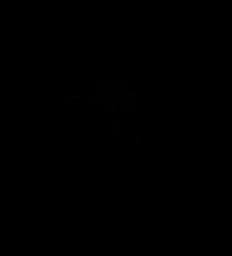

[Series 15: FLAIR · axial · 3.0mm · 0.53mm/px · z∈[-80,+81]mm · 3 of 55 slices shown]
[im 1/55]
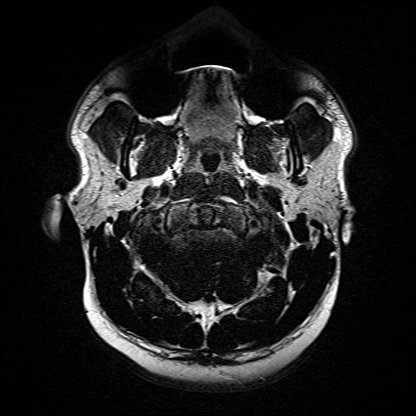
[im 28/55]
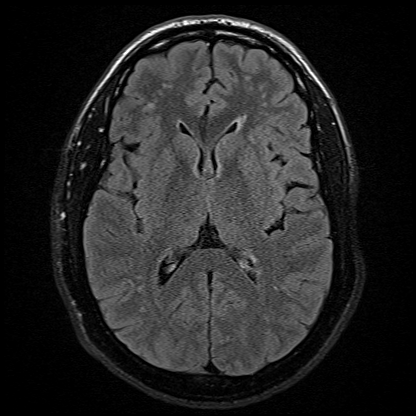
[im 55/55]
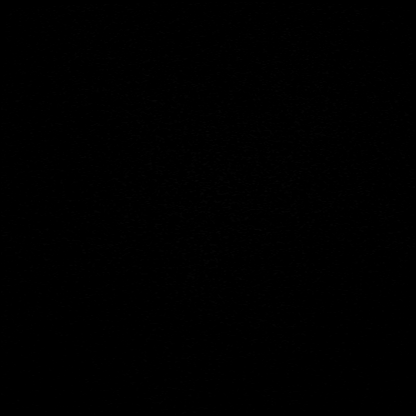

[Series 16: T1 · axial · 1.0mm · 0.98mm/px · z∈[-88,+86]mm · 8 of 176 slices shown (2 of 2)]
[im 1/176]
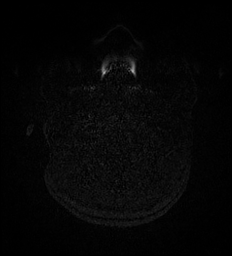
[im 20/176]
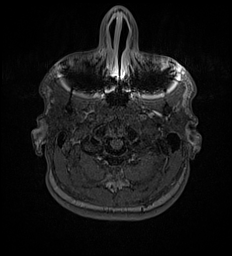
[im 59/176]
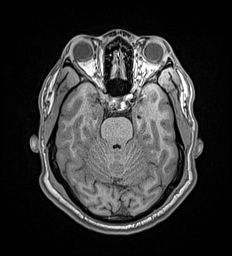
[im 78/176]
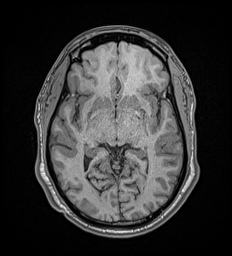
[im 98/176]
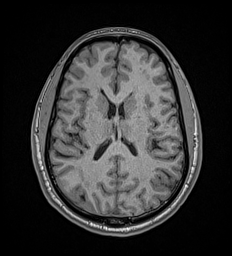
[im 117/176]
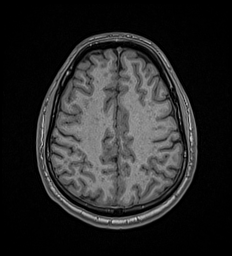
[im 156/176]
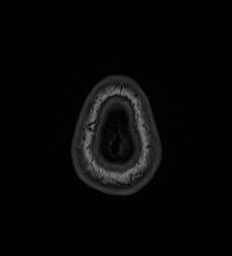
[im 176/176]
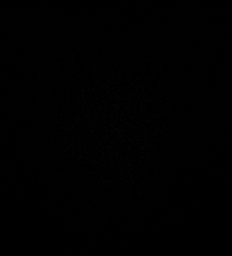

[Series 17: T2 post-contrast · coronal · 5.0mm · 0.57mm/px · 2 of 30 slices shown]
[im 1/30]
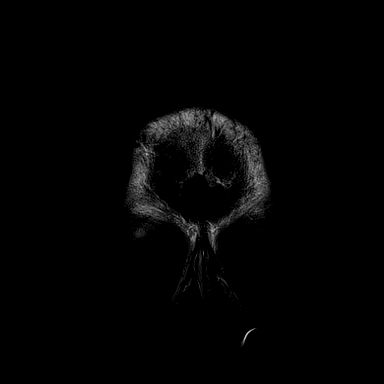
[im 30/30]
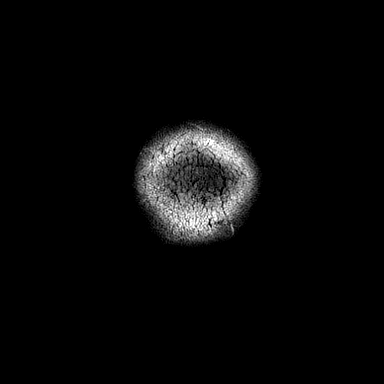

[Series 18: T1 post-contrast · axial · 1.0mm · 0.98mm/px · z∈[-88,+86]mm · 10 of 176 slices shown (1 of 2)]
[im 1/176]
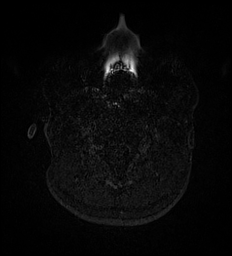
[im 20/176]
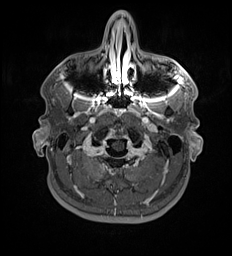
[im 39/176]
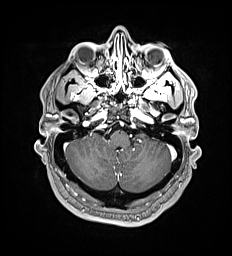
[im 59/176]
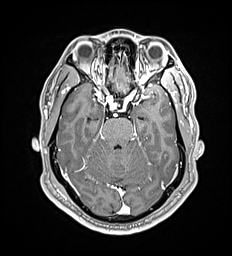
[im 78/176]
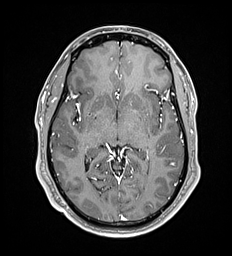
[im 98/176]
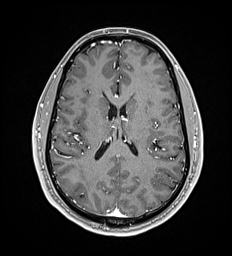
[im 117/176]
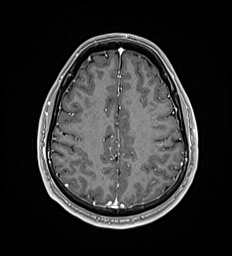
[im 137/176]
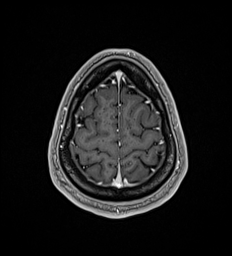
[im 156/176]
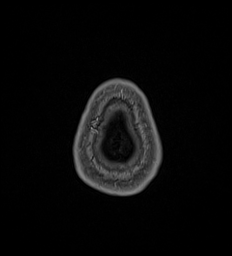
[im 176/176]
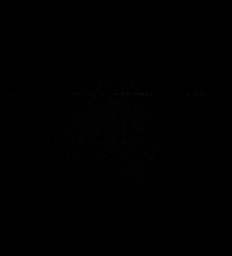

[Series 19: T1 post-contrast · coronal · 5.0mm · 0.57mm/px · 2 of 30 slices shown (2 of 2)]
[im 1/30]
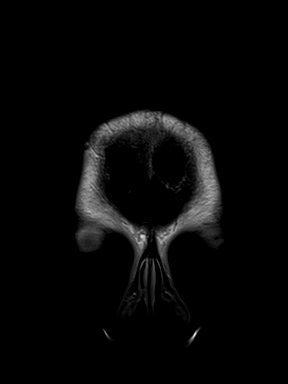
[im 30/30]
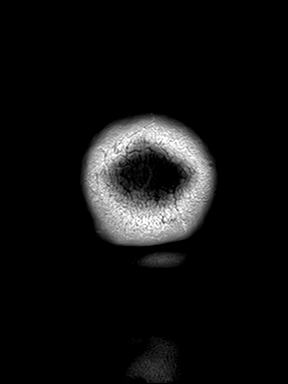

[46 of 48 positions shown; findings below may reference images not displayed]

FINDINGS: Brain: Examination somewhat technically limited by susceptibility
artifact from dental hardware.

Cerebral volume within normal limits for age. There are scattered
patchy foci of T2/FLAIR signal primarily involving the subcortical
white matter of both cerebral hemispheres, perhaps slightly worse on
the right as compared to the left. Relatively mild involvement of
the deep and periventricular white matter in comparison. No visible
infratentorial involvement on this exam. No convincing associated
enhancement. Apparent subtle patchy enhancement involving the left
greater than right subcortical parieto-occipital regions noted on
axial postcontrast sequence, not seen on corresponding coronal
sequence, and favored to be artifactual (series 18, image 105).

No evidence for acute or subacute infarct. Gray-white matter
differentiation maintained. No areas of chronic cortical infarction.
No visible acute or chronic intracranial blood products.

No mass lesion, midline shift or mass effect no hydrocephalus or
extra-axial fluid collection. Pituitary gland suprasellar region
grossly within normal limits. Midline structures intact. No other
abnormal enhancement.

Vascular: Major intracranial vascular flow voids are maintained.

Skull and upper cervical spine: Craniocervical junction within
normal limits. Signal intensity within the visualized bone marrow
somewhat diffusely decreased on T1 weighted imaging, nonspecific,
but most commonly related to anemia, smoking or obesity. No focal
marrow replacing lesion. No scalp soft tissue abnormality.

Sinuses/Orbits: Visualized globes and orbital soft tissues grossly
within normal limits, although evaluation limited by motion and
susceptibility artifact. Visualized paranasal sinuses are grossly
clear. No mastoid effusion.

Other: None.
IMPRESSION: 1. Scattered patchy foci of T2/FLAIR signal abnormality involving
primarily the subcortical white matter of both cerebral hemispheres,
overall moderate in nature, and advanced for age. Differential
considerations are broad, and include sequelae of
accelerated/hereditary chronic small-vessel ischemia, changes
related to prior trauma, hypercoagulable state, vasculitis, sequelae
of complicated migraines, prior infectious or inflammatory process,
or demyelination. No convincing associated enhancement.
2. Otherwise unremarkable brain MRI. No other acute intracranial
abnormality.
